# Patient Record
Sex: Male | Born: 1970 | Race: Black or African American | Hispanic: No | Marital: Single | Smoking: Former smoker
Health system: Southern US, Community
[De-identification: ages and names within clinical notes are randomized; demographics above are authoritative.]

---

## 2020-11-19 ENCOUNTER — Encounter (HOSPITAL_COMMUNITY): Payer: Self-pay | Admitting: *Deleted

## 2020-11-19 ENCOUNTER — Emergency Department (HOSPITAL_COMMUNITY)
Admission: EM | Admit: 2020-11-19 | Discharge: 2020-11-19 | Disposition: A | Discharge status: Home / Self Care | Attending: Emergency Medicine | Admitting: Emergency Medicine

## 2020-11-19 ENCOUNTER — Other Ambulatory Visit: Payer: Self-pay

## 2020-11-19 ENCOUNTER — Emergency Department (HOSPITAL_COMMUNITY)

## 2020-11-19 DIAGNOSIS — X58XXXA Exposure to other specified factors, initial encounter: Secondary | ICD-10-CM | POA: Diagnosis not present

## 2020-11-19 DIAGNOSIS — S99921A Unspecified injury of right foot, initial encounter: Secondary | ICD-10-CM | POA: Diagnosis present

## 2020-11-19 DIAGNOSIS — Z87891 Personal history of nicotine dependence: Secondary | ICD-10-CM | POA: Insufficient documentation

## 2020-11-19 DIAGNOSIS — S93401A Sprain of unspecified ligament of right ankle, initial encounter: Secondary | ICD-10-CM | POA: Diagnosis not present

## 2020-11-19 NOTE — ED Triage Notes (Signed)
Pt with c/o pain to top of right foot, pt states "I think I sprained it".  Pt states able to put weight on it.

## 2020-11-19 NOTE — Discharge Instructions (Addendum)
Your x-rays today did not show evidence of any broken bones.  You likely have a sprain of your ankle.  Wear the ankle brace as needed for support when standing or walking.  Apply ice packs on and off to your ankle and elevate when possible.  Ibuprofen, 800 mg 3 times a day with food if needed for pain.  Follow-up with the orthopedic provider listed in 1 week if not improving.

## 2020-11-21 NOTE — ED Provider Notes (Signed)
[facility] EMERGENCY DEPARTMENT Provider Note   CSN: [id number] Arrival date & time: 11/19/20  1103     History Chief Complaint  Patient presents with   Ankle Pain    Benjamin Curry is a 50 y.o. male.   Ankle Pain Associated symptoms: no back pain, no fever and no neck pain       Benjamin Curry is a 50 y.o. male who presents to the Emergency Department complaining of right foot and ankle pain.  Symptoms began 1-2 days ago.  Describes an aching pain to the lateral top of his foot.  Pain worse with weight bearing.  Denies known injury, redness or swelling. No calf pain or swelling.       No past medical history on file.  There are no problems to display for this patient.    No family history on file.  Social History   Tobacco Use   Smoking status: Former    Pack years: 0.00    Types: Cigarettes   Smokeless tobacco: Never  Substance Use Topics   Alcohol use: Never    Home Medications Prior to Admission medications   Not on File    Allergies    Patient has no known allergies.  Review of Systems   Review of Systems  Constitutional:  Negative for chills and fever.  Genitourinary:  Negative for difficulty urinating and dysuria.  Musculoskeletal:  Positive for arthralgias (right ankle and foot pain). Negative for back pain, joint swelling and neck pain.  Skin:  Negative for color change and wound.  Neurological:  Negative for weakness and numbness.   Physical Exam Updated Vital Signs BP 125/90 (BP Location: Right Arm)   Pulse (!) 55   Temp 98.6 F (37 C) (Oral)   Resp 20   Ht 5\' 10"  (1.778 m)   Wt 93 kg   SpO2 97%   BMI 29.41 kg/m   Physical Exam Vitals and nursing note reviewed.  Constitutional:      General: He is not in acute distress.    Appearance: Normal appearance. He is not toxic-appearing.  HENT:     Head: Atraumatic.  Neck:     Thyroid: No thyromegaly.     Meningeal: Kernig's sign absent.  Cardiovascular:     Rate and Rhythm: Normal rate  and regular rhythm.     Pulses: Normal pulses.  Pulmonary:     Effort: Pulmonary effort is normal.     Breath sounds: Normal breath sounds. No wheezing.  Abdominal:     Palpations: Abdomen is soft.     Tenderness: There is no abdominal tenderness. There is no guarding or rebound.  Musculoskeletal:        General: Tenderness present. No swelling or deformity. Normal range of motion.     Cervical back: Normal range of motion and neck supple.     Comments: Diffuse ttp of the lateral and dorsal right foot.  No edema, erythema, or excessive warmth  Skin:    General: Skin is warm.     Capillary Refill: Capillary refill takes less than 2 seconds.     Findings: No rash.  Neurological:     General: No focal deficit present.     Mental Status: He is alert.     Sensory: No sensory deficit.     Motor: No weakness.    ED Results / Procedures / Treatments   Labs (all labs ordered are listed, but only abnormal results are displayed) Labs Reviewed - No data  to display  EKG None  Radiology DG Ankle Complete Right  Result Date: 11/19/2020 CLINICAL DATA:  Right ankle pain EXAM: RIGHT ANKLE - COMPLETE 3+ VIEW COMPARISON:  None. FINDINGS: Three view radiograph right ankle demonstrates normal alignment. No acute fracture or dislocation. Probable remote ununited fracture of the dorsal aspect of the anterior process of the talus. Ankle mortise is aligned. No ankle effusion. Soft tissues are unremarkable. IMPRESSION: No acute fracture or dislocation. Electronically Signed   By: Helyn Numbers MD   On: 11/19/2020 11:48   DG Foot Complete Right  Result Date: 11/19/2020 CLINICAL DATA:  Right foot pain EXAM: RIGHT FOOT COMPLETE - 3+ VIEW COMPARISON:  None. FINDINGS: Mild first ray hallux valgus deformity. Otherwise normal alignment. No acute fracture or dislocation. Corticated density seen in involving the dorsal aspect of the anterior process of the talus likely represents a remote avulsion fracture. Mild  midfoot degenerative arthritis. No ankle effusion. Soft tissues are unremarkable. IMPRESSION: No acute fracture or dislocation. Electronically Signed   By: Helyn Numbers MD   On: 11/19/2020 11:47    Procedures Procedures   Medications Ordered in ED Medications - No data to display  ED Course  I have reviewed the triage vital signs and the nursing notes.  Pertinent labs & imaging results that were available during my care of the patient were reviewed by me and considered in my medical decision making (see chart for details).    MDM Rules/Calculators/A&P                          Pt here with pain of right foot and ankle.  No concerning sx's of septic joint, NV intact.  XR w/o acute bony findings  Pt agrees to symptomatic tx, RICE therapy ASO given.  Ortho f/u in one week if needed  Final Clinical Impression(s) / ED Diagnoses Final diagnoses:  Sprain of right ankle, unspecified ligament, initial encounter    Rx / DC Orders ED Discharge Orders     None        Pauline Aus, PA-C 11/21/20 1001    Bethann Berkshire, MD 11/23/20 1001

## 2022-07-06 IMAGING — DX DG ANKLE COMPLETE 3+V*R*
3 series · 3 of 3 positions shown · non-contrast
Comparison: None.

CLINICAL DATA: Right ankle pain

EXAM:
RIGHT ANKLE - COMPLETE 3+ VIEW

[ankle ap]
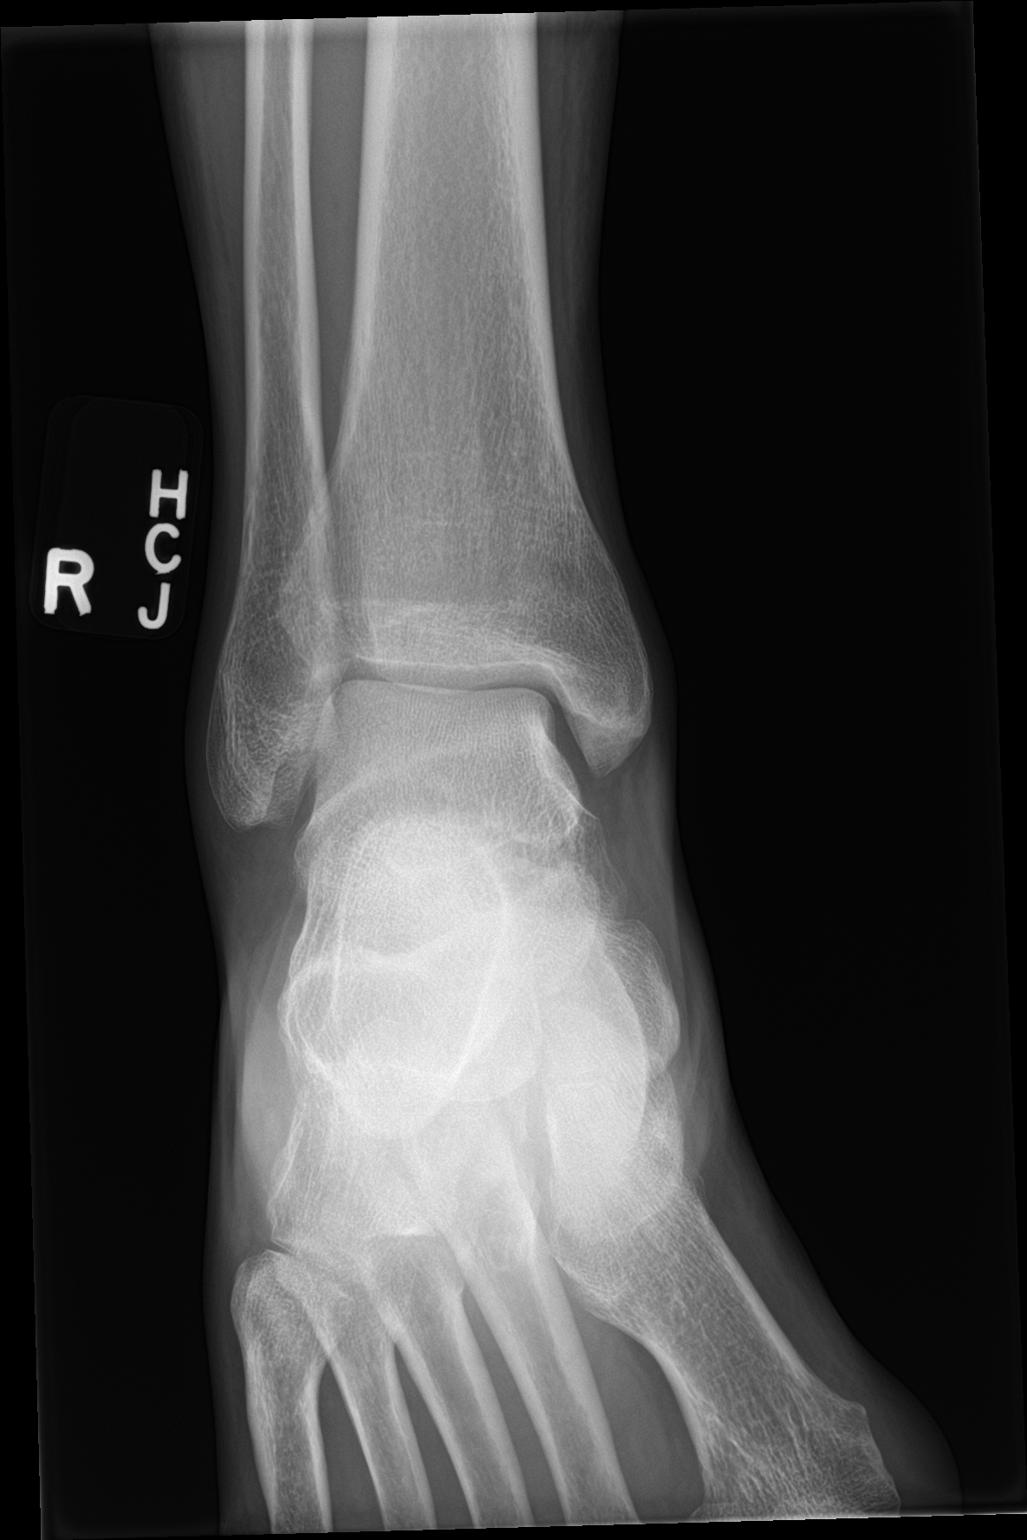

[ankle obl]
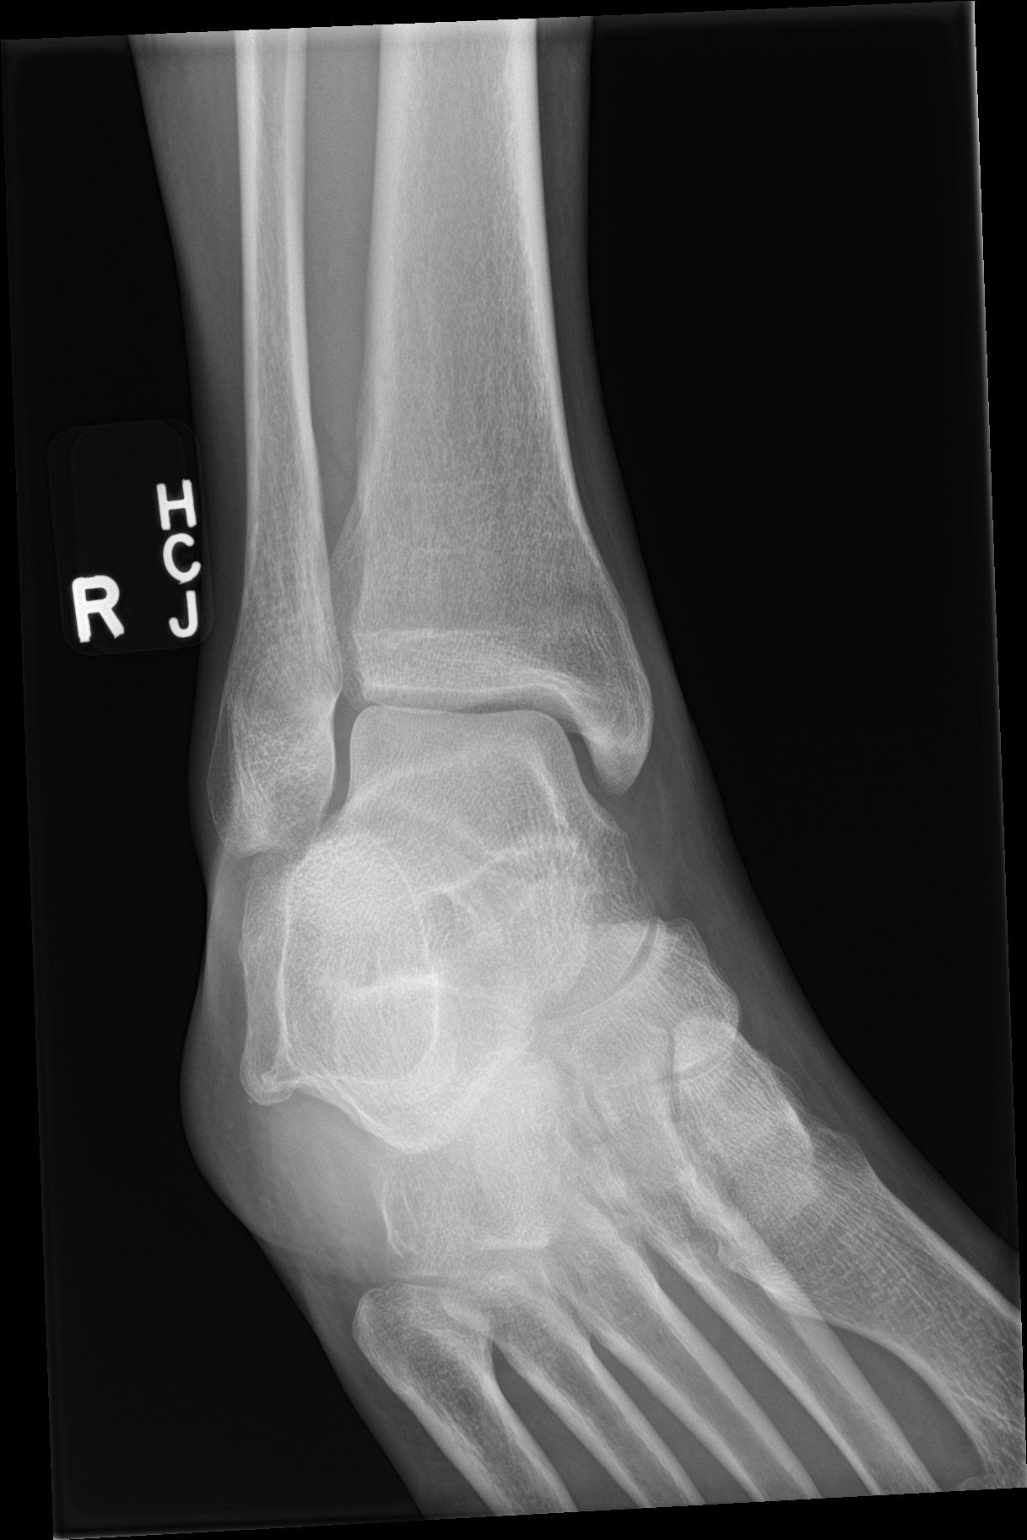

[ankle lat]
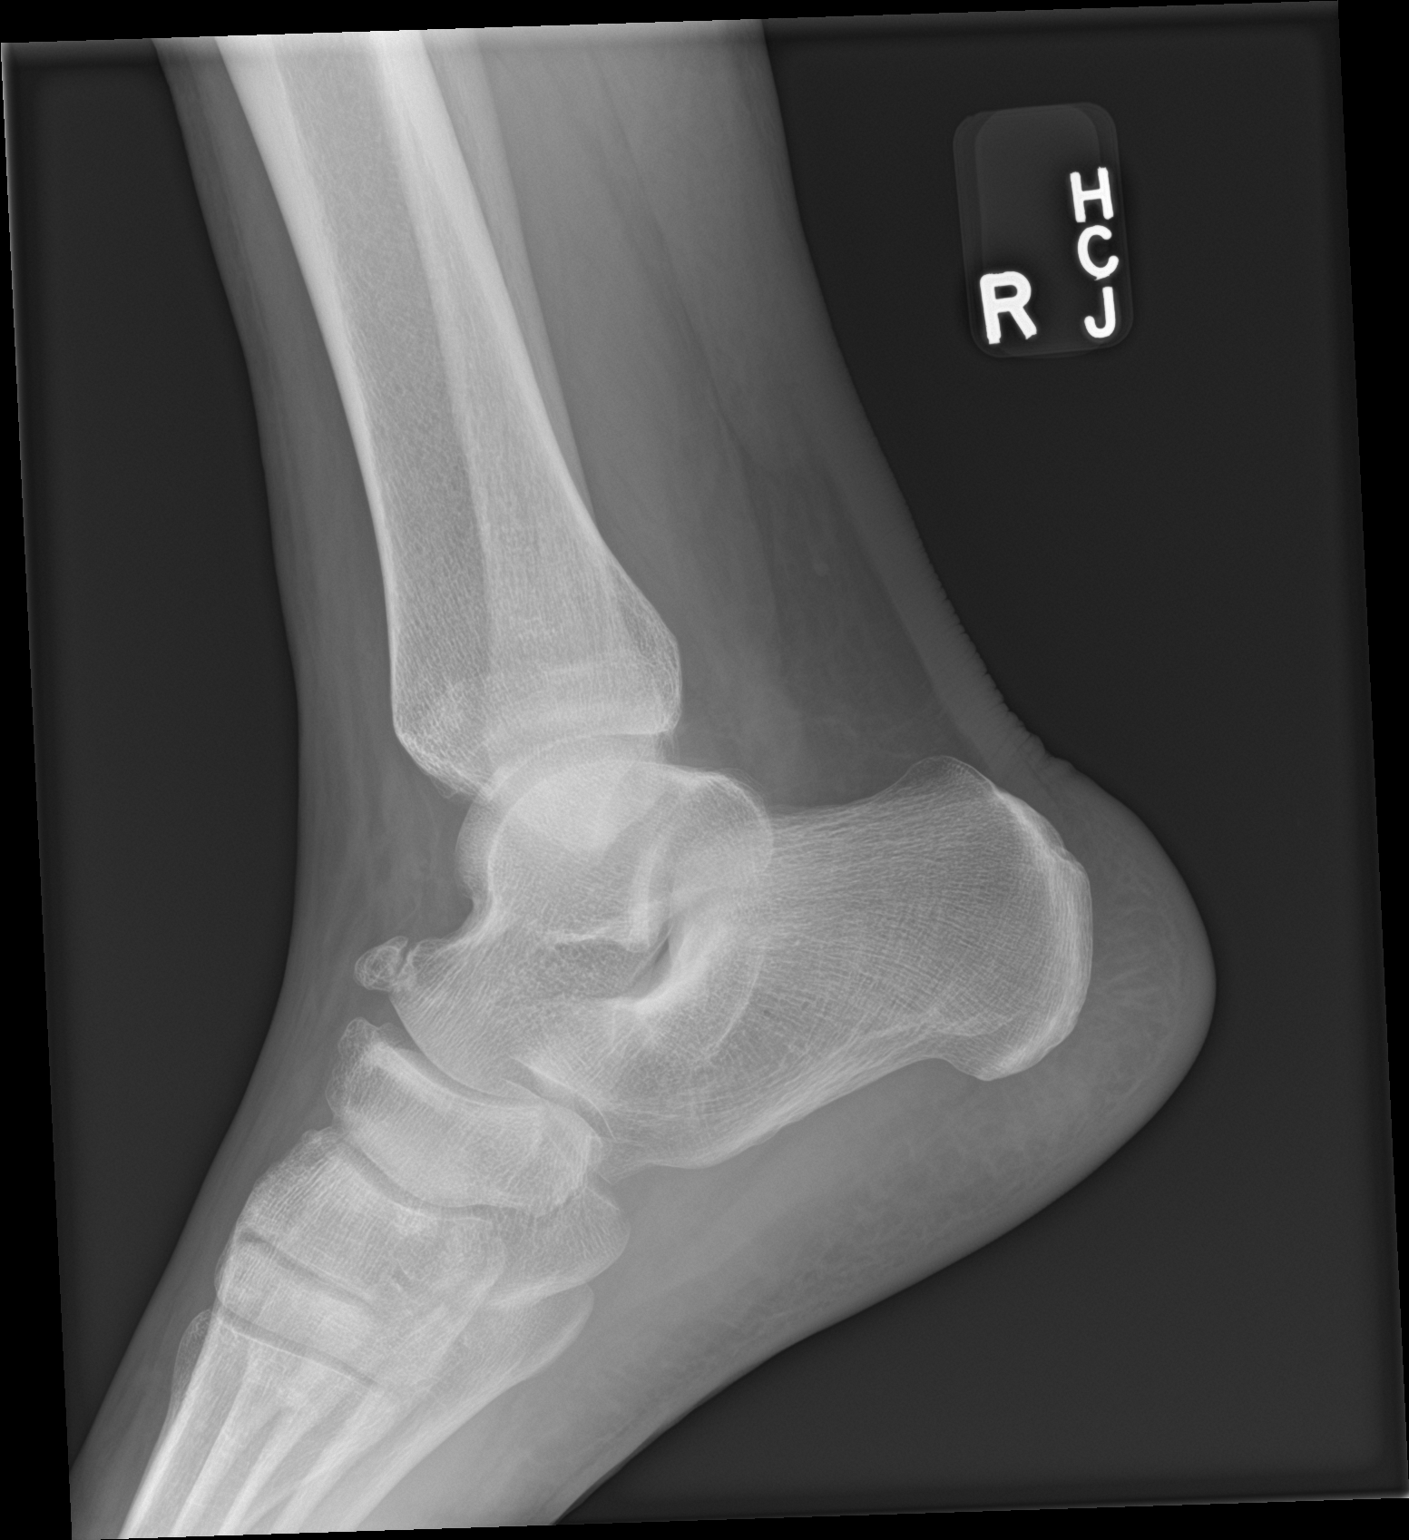

[3 of 3 positions shown; findings below may reference images not displayed]

FINDINGS: Three view radiograph right ankle demonstrates normal alignment. No
acute fracture or dislocation. Probable remote ununited fracture of
the dorsal aspect of the anterior process of the talus. Ankle
mortise is aligned. No ankle effusion. Soft tissues are
unremarkable.
IMPRESSION: No acute fracture or dislocation.
